# Patient Record
Sex: Female | Born: 1980 | Hispanic: Yes | Marital: Married | Smoking: Never smoker
Health system: Southern US, Community
[De-identification: ages and names within clinical notes are randomized; demographics above are authoritative.]

---

## 2019-11-15 ENCOUNTER — Encounter: Payer: Self-pay | Admitting: Emergency Medicine

## 2019-11-15 ENCOUNTER — Emergency Department (INDEPENDENT_AMBULATORY_CARE_PROVIDER_SITE_OTHER): Payer: Self-pay

## 2019-11-15 ENCOUNTER — Other Ambulatory Visit: Payer: Self-pay

## 2019-11-15 ENCOUNTER — Emergency Department (INDEPENDENT_AMBULATORY_CARE_PROVIDER_SITE_OTHER)
Admission: EM | Admit: 2019-11-15 | Discharge: 2019-11-15 | Disposition: A | Discharge status: Home / Self Care | Payer: Self-pay | Source: Home / Self Care

## 2019-11-15 DIAGNOSIS — M5442 Lumbago with sciatica, left side: Secondary | ICD-10-CM

## 2019-11-15 DIAGNOSIS — N3001 Acute cystitis with hematuria: Secondary | ICD-10-CM

## 2019-11-15 LAB — POCT URINALYSIS DIP (MANUAL ENTRY)
Bilirubin, UA: NEGATIVE
Glucose, UA: NEGATIVE mg/dL
Nitrite, UA: NEGATIVE
Protein Ur, POC: NEGATIVE mg/dL
Spec Grav, UA: 1.02 (ref 1.010–1.025)
Urobilinogen, UA: 0.2 E.U./dL
pH, UA: 7.5 (ref 5.0–8.0)

## 2019-11-15 MED ORDER — METHYLPREDNISOLONE SODIUM SUCC 40 MG IJ SOLR
80.0000 mg | Freq: Once | INTRAMUSCULAR | Status: AC
  Administered 2019-11-15: 80 mg via INTRAMUSCULAR

## 2019-11-15 MED ORDER — CEPHALEXIN 500 MG PO CAPS: 500.0000 mg | ORAL_CAPSULE | Freq: Two times a day (BID) | ORAL | 0 refills | Status: AC

## 2019-11-15 MED ORDER — KETOROLAC TROMETHAMINE 60 MG/2ML IM SOLN
60.0000 mg | Freq: Once | INTRAMUSCULAR | Status: AC
  Administered 2019-11-15: 60 mg via INTRAMUSCULAR

## 2019-11-15 NOTE — ED Provider Notes (Signed)
[facility]    CSN: [id number] Arrival date & time: 11/15/19  1708      History   Chief Complaint Chief Complaint  Patient presents with  . Sciatica    HPI Lindsey Pena is a 39 y.o. female.   Language interpreter used.  HPI Lindsey Pena is a 39 y.o. female presenting to UC with c/o Left side sciatic pain for the last 4 days, gradually worsening. No known injuries. Pain is 10/10, worse with certain movements. Hx of similar pain in the past but never sought medical care, treated her symptoms at home.     No past medical history on file.  There are no problems to display for this patient.    OB History   No obstetric history on file.      Home Medications    Prior to Admission medications   Medication Sig Start Date End Date Taking? Authorizing Provider  cephALEXin (KEFLEX) 500 MG capsule Take 1 capsule (500 mg total) by mouth 2 (two) times daily. 11/15/19   Lurene Shadow, PA-C    Family History No family history on file.  Social History Social History   Tobacco Use  . Smoking status: Never Smoker  . Smokeless tobacco: Never Used  Vaping Use  . Vaping Use: Never used  Substance Use Topics  . Alcohol use: Not Currently  . Drug use: Not Currently     Allergies   Patient has no known allergies.   Review of Systems Review of Systems  Constitutional: Negative for chills and fever.  Gastrointestinal: Negative for abdominal pain, diarrhea, nausea and vomiting.  Genitourinary: Positive for flank pain (left). Negative for dysuria.  Musculoskeletal: Positive for back pain ( left lower), gait problem and myalgias.     Physical Exam Triage Vital Signs ED Triage Vitals [11/15/19 1713]  Enc Vitals Group     BP 114/78     Pulse Rate 87     Resp      Temp 98.8 F (37.1 C)     Temp Source Oral     SpO2 99 %     Weight 164 lb (74.4 kg)     Height 5\' 4"  (1.626 m)     Head Circumference      Peak Flow      Pain Score 10      Pain Loc      Pain Edu?      Excl. in GC?    No data found.  Updated Vital Signs BP 114/78 (BP Location: Right Arm)   Pulse 87   Temp 98.8 F (37.1 C) (Oral)   Ht 5\' 4"  (1.626 m)   Wt 164 lb (74.4 kg)   SpO2 99%   BMI 28.15 kg/m   Visual Acuity Right Eye Distance:   Left Eye Distance:   Bilateral Distance:    Right Eye Near:   Left Eye Near:    Bilateral Near:     Physical Exam Vitals and nursing note reviewed.  Constitutional:      General: She is in acute distress.     Appearance: Normal appearance. She is well-developed. She is not ill-appearing, toxic-appearing or diaphoretic.     Comments: Pt tearful, sitting in wheelchair, rubbing her Left hip and thigh  HENT:     Head: Normocephalic and atraumatic.  Cardiovascular:     Rate and Rhythm: Normal rate and regular rhythm.  Pulmonary:     Effort: Pulmonary effort is normal. No respiratory distress.  Breath sounds: Normal breath sounds.  Musculoskeletal:        General: Tenderness present. Normal range of motion.     Cervical back: Normal range of motion.     Comments: No spinal tenderness. Tenderness to Left lower lumbar muscles and Left thigh. Antalgic gait.   Skin:    General: Skin is warm and dry.     Findings: No rash.  Neurological:     Mental Status: She is alert and oriented to person, place, and time.  Psychiatric:        Behavior: Behavior normal.      UC Treatments / Results  Labs (all labs ordered are listed, but only abnormal results are displayed) Labs Reviewed  POCT URINALYSIS DIP (MANUAL ENTRY) - Abnormal; Notable for the following components:      Result Value   Clarity, UA cloudy (*)    Ketones, POC UA small (15) (*)    Blood, UA moderate (*)    Leukocytes, UA Small (1+) (*)    All other components within normal limits  URINE CULTURE    EKG   Radiology Narrative & Impression  CLINICAL DATA:  Severe pain, LEFT sciatica for 4 days  EXAM: LUMBAR SPINE - COMPLETE 4+  VIEW  COMPARISON:  11/15/2019  FINDINGS: No sign of fracture or of static subluxation. Vertebral body heights and disc spaces are maintained.  IMPRESSION: Negative.   Electronically Signed   By: Donzetta Kohut M.D.   On: 11/15/2019 18:35     Procedures Procedures (including critical care time)  Medications Ordered in UC Medications  ketorolac (TORADOL) injection 60 mg (60 mg Intramuscular Given 11/15/19 1738)  methylPREDNISolone sodium succinate (SOLU-MEDROL) 40 mg/mL injection 80 mg (80 mg Intramuscular Given 11/15/19 1855)    Initial Impression / Assessment and Plan / UC Course  I have reviewed the triage vital signs and the nursing notes.  Pertinent labs & imaging results that were available during my care of the patient were reviewed by me and considered in my medical decision making (see chart for details).     Hx and exam c/w left lower back pain with sciatica and UTI with hematuria, however, due to continued severe pain in UC despite toradol 60mg  IM and solumedrol 80mg  IM, recommend further evaluation in emergency department. Pt understanding and agreeable with plan. Pt called family member to drive her.  Final Clinical Impressions(s) / UC Diagnoses   Final diagnoses:  Acute left-sided low back pain with left-sided sciatica  Acute cystitis with hematuria     Discharge Instructions      Due to the severity of your pain, it is recommended you go to the emergency department for further evaluation and treatment of your symptoms.  You have declined EMS transport but it is still recommended you go to the hospital for further evaluation and treatment.     ED Prescriptions    Medication Sig Dispense Auth. Provider   cephALEXin (KEFLEX) 500 MG capsule Take 1 capsule (500 mg total) by mouth 2 (two) times daily. 14 capsule ,     PDMP not reviewed this encounter.   Lurene Shadow, [place] 11/18/19 [phone]

## 2019-11-15 NOTE — Discharge Instructions (Signed)
  Due to the severity of your pain, it is recommended you go to the emergency department for further evaluation and treatment of your symptoms.  You have declined EMS transport but it is still recommended you go to the hospital for further evaluation and treatment.

## 2019-11-15 NOTE — ED Triage Notes (Signed)
Left side sciatica x 4 days

## 2019-11-17 LAB — URINE CULTURE
MICRO NUMBER:: 11003177
SPECIMEN QUALITY:: ADEQUATE

## 2021-07-03 IMAGING — DX DG LUMBAR SPINE COMPLETE 4+V
5 series · 5 of 5 positions shown · non-contrast
Comparison: 11/15/2019

CLINICAL DATA: Severe pain, LEFT sciatica for 4 days

EXAM:
LUMBAR SPINE - COMPLETE 4+ VIEW

[l-spine ap]
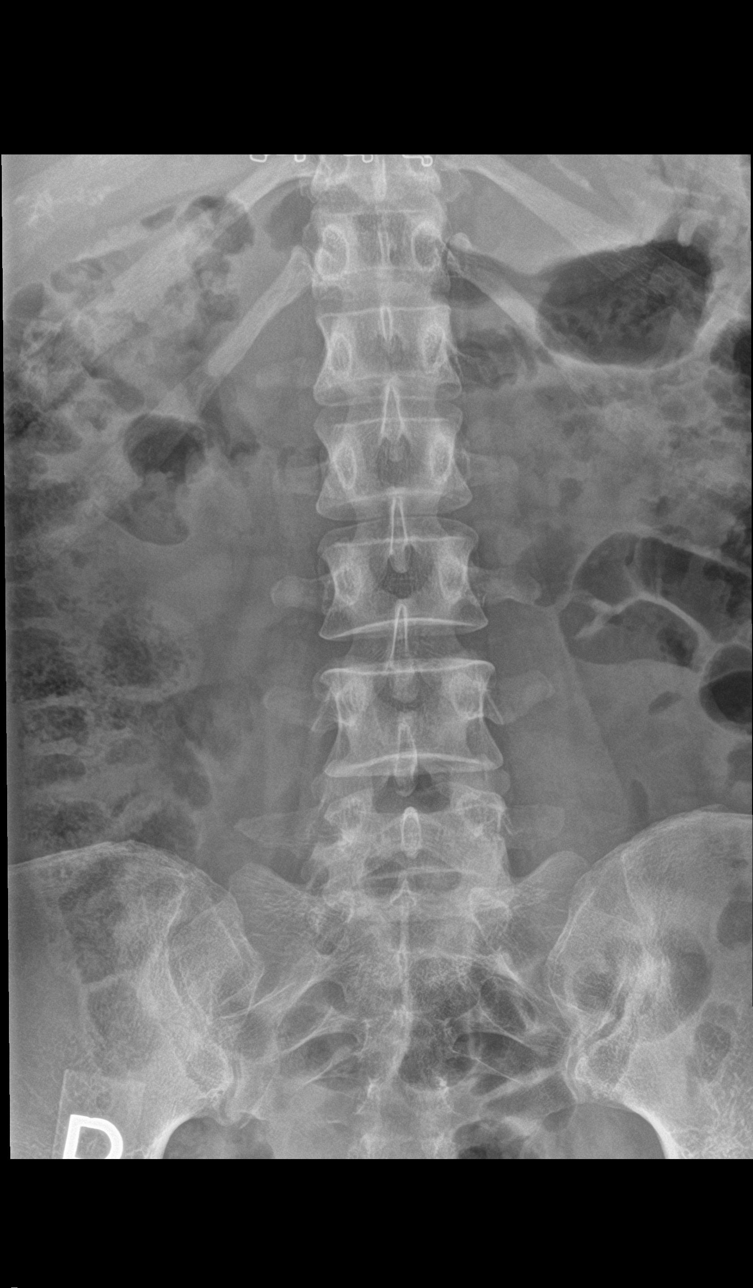

[l-spine obl (1 of 2)]
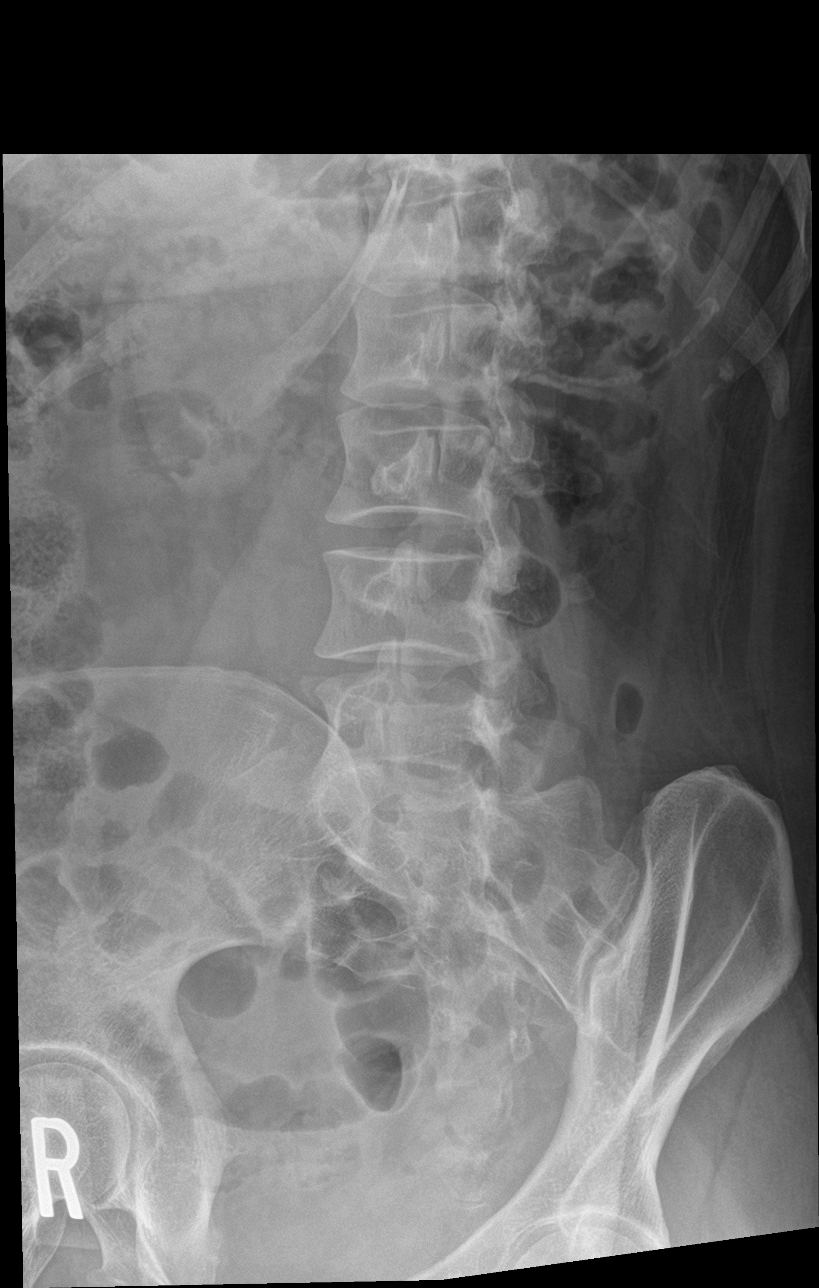

[l-spine obl (2 of 2)]
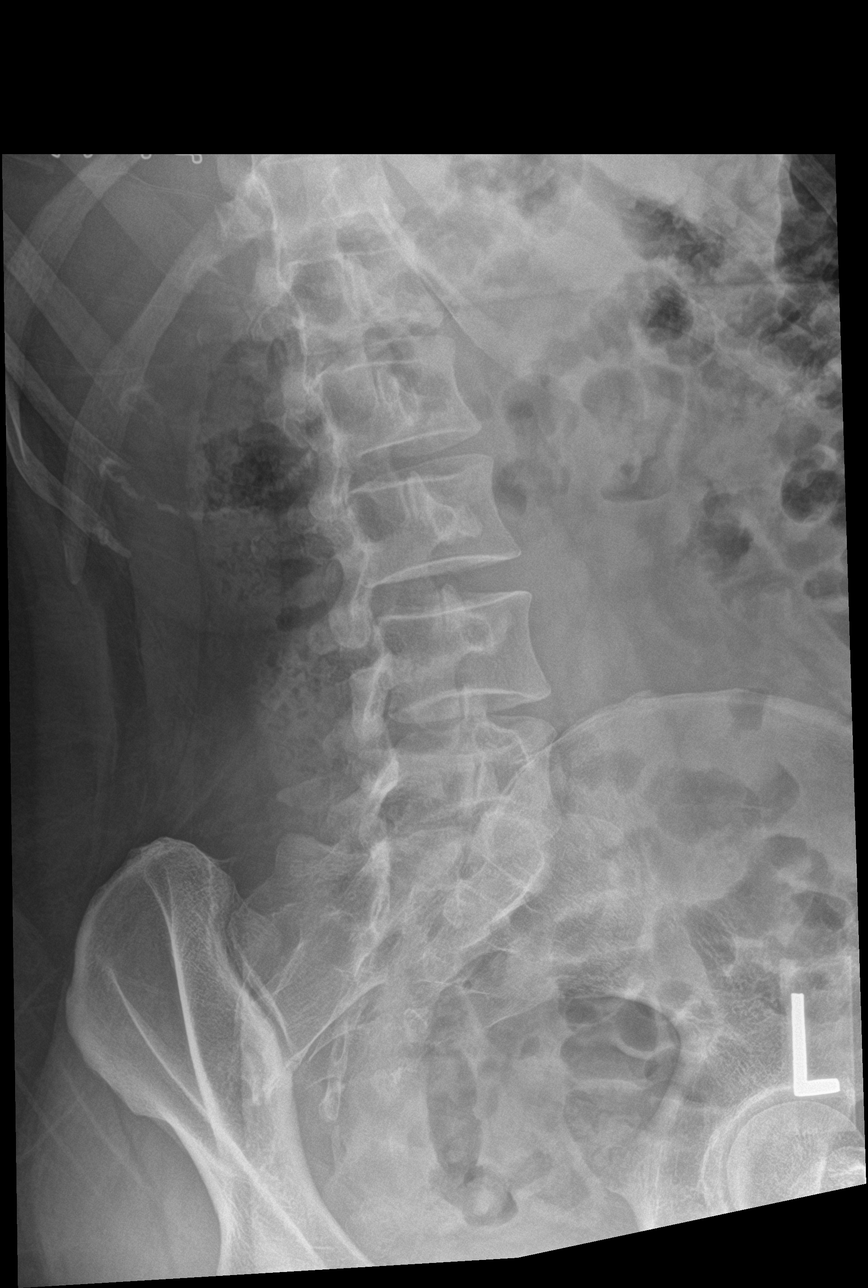

[l-spine lat]
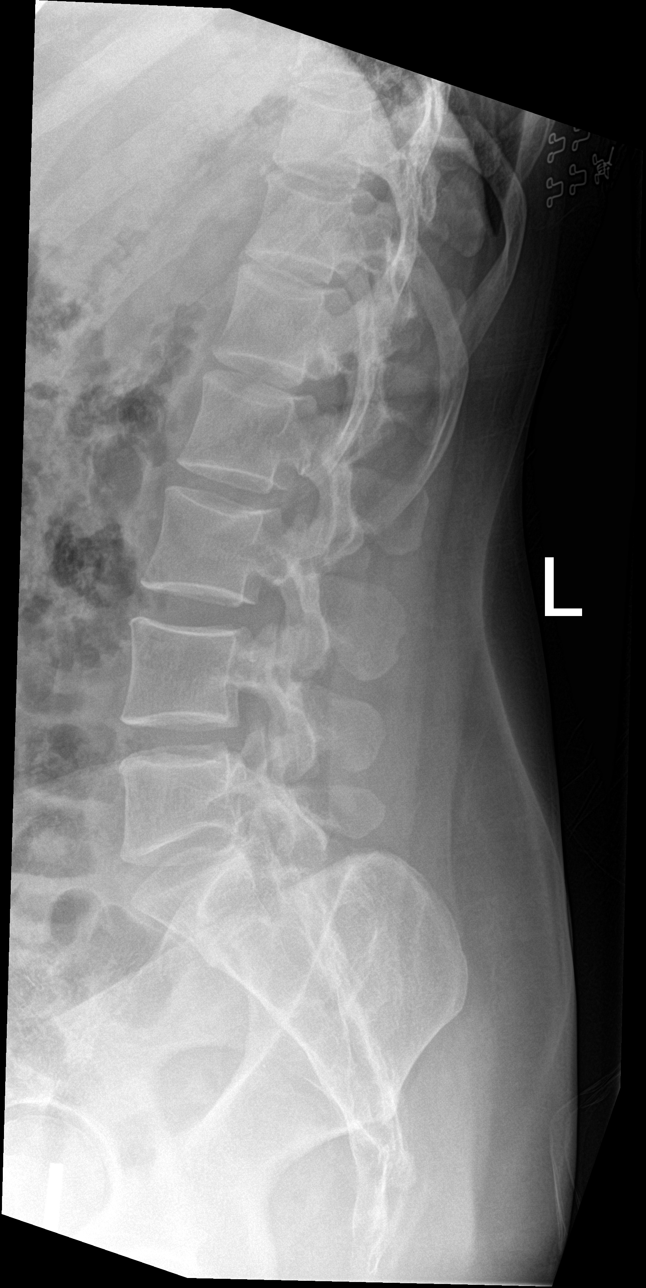

[l-spine spot]
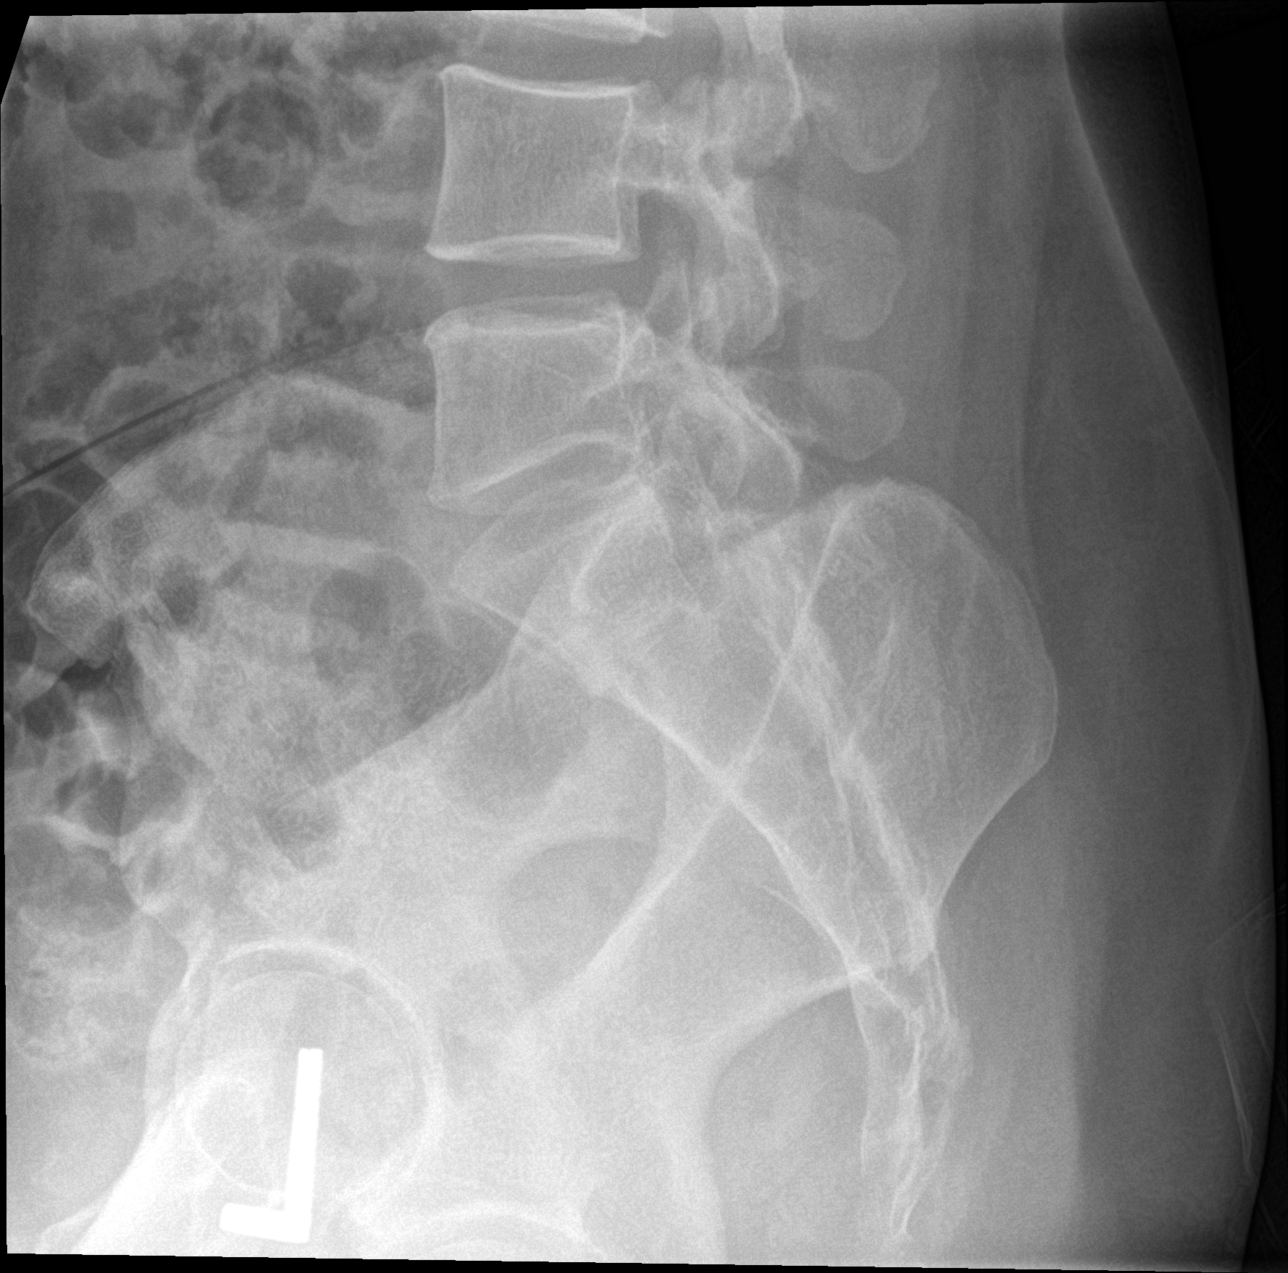

[5 of 5 positions shown; findings below may reference images not displayed]

FINDINGS: No sign of fracture or of static subluxation. Vertebral body heights
and disc spaces are maintained.
IMPRESSION: Negative.
